# Patient Record
Sex: Male | Born: 1975 | Race: Black or African American | Hispanic: No | Marital: Single | Smoking: Current every day smoker
Health system: Southern US, Community
[De-identification: ages and names within clinical notes are randomized; demographics above are authoritative.]

## PROBLEM LIST (undated history)

## (undated) ENCOUNTER — Ambulatory Visit: Source: Home / Self Care

---

## 2005-09-19 ENCOUNTER — Emergency Department (HOSPITAL_COMMUNITY): Admission: EM | Admit: 2005-09-19 | Discharge: 2005-09-19 | Payer: Self-pay | Admitting: Emergency Medicine

## 2009-03-08 ENCOUNTER — Emergency Department (HOSPITAL_COMMUNITY): Admission: EM | Admit: 2009-03-08 | Discharge: 2009-03-08 | Payer: Self-pay | Admitting: Emergency Medicine

## 2011-04-11 ENCOUNTER — Emergency Department (HOSPITAL_COMMUNITY)
Admission: EM | Admit: 2011-04-11 | Discharge: 2011-04-11 | Disposition: A | Discharge status: Home / Self Care | Payer: Self-pay | Attending: Emergency Medicine | Admitting: Emergency Medicine

## 2011-04-11 DIAGNOSIS — R51 Headache: Secondary | ICD-10-CM | POA: Insufficient documentation

## 2011-04-11 DIAGNOSIS — H539 Unspecified visual disturbance: Secondary | ICD-10-CM | POA: Insufficient documentation

## 2012-07-08 ENCOUNTER — Encounter (HOSPITAL_COMMUNITY): Payer: Self-pay | Admitting: Emergency Medicine

## 2012-07-08 ENCOUNTER — Emergency Department (HOSPITAL_COMMUNITY): Payer: Worker's Compensation

## 2012-07-08 ENCOUNTER — Emergency Department (HOSPITAL_COMMUNITY)
Admission: EM | Admit: 2012-07-08 | Discharge: 2012-07-08 | Disposition: A | Discharge status: Home / Self Care | Payer: Worker's Compensation | Attending: Emergency Medicine | Admitting: Emergency Medicine

## 2012-07-08 DIAGNOSIS — T148XXA Other injury of unspecified body region, initial encounter: Secondary | ICD-10-CM

## 2012-07-08 DIAGNOSIS — X500XXA Overexertion from strenuous movement or load, initial encounter: Secondary | ICD-10-CM | POA: Insufficient documentation

## 2012-07-08 DIAGNOSIS — Y9269 Other specified industrial and construction area as the place of occurrence of the external cause: Secondary | ICD-10-CM | POA: Insufficient documentation

## 2012-07-08 DIAGNOSIS — F17200 Nicotine dependence, unspecified, uncomplicated: Secondary | ICD-10-CM | POA: Insufficient documentation

## 2012-07-08 DIAGNOSIS — M25519 Pain in unspecified shoulder: Secondary | ICD-10-CM | POA: Insufficient documentation

## 2012-07-08 MED ORDER — KETOROLAC TROMETHAMINE 60 MG/2ML IM SOLN
60.0000 mg | Freq: Once | INTRAMUSCULAR | Status: AC
  Administered 2012-07-08: 60 mg via INTRAMUSCULAR
  Filled 2012-07-08: qty 2

## 2012-07-08 MED ORDER — OXYCODONE-ACETAMINOPHEN 5-325 MG PO TABS: 2.0000 | ORAL_TABLET | ORAL | Status: AC | PRN

## 2012-07-08 NOTE — ED Notes (Signed)
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

## 2012-07-08 NOTE — ED Provider Notes (Signed)
History     CSN: [id number]  Arrival date & time 07/08/12  1859   First MD Initiated Contact with Patient 07/08/12 2032      Chief Complaint  Patient presents with  . Shoulder Pain    (Consider location/radiation/quality/duration/timing/severity/associated sxs/prior treatment) Patient is a 36 y.o. male presenting with shoulder pain. The history is provided by the patient.  Shoulder Pain   36 year old, right-hand dominant male, complains of pain in the right side of his back.  He was at work, lifting something heavy and he felt a pop in the right side of his back.  Since then it has been difficult to raise his right arm.  Because of the pain.  He did not fall.  He is right-hand-dominant.  He denies prior injury to his shoulder or back.  He has no other complaints.  History reviewed. No pertinent past medical history.  History reviewed. No pertinent past surgical history.  No family history on file.  History  Substance Use Topics  . Smoking status: Current Everyday Smoker -- 1.0 packs/day  . Smokeless tobacco: Not on file  . Alcohol Use: Yes     occasion      Review of Systems  Musculoskeletal: Positive for back pain.  Neurological: Negative for weakness.  Hematological: Does not bruise/bleed easily.    Allergies  Review of patient's allergies indicates no known allergies.  Home Medications  No current outpatient prescriptions on file.  BP 122/70  Pulse 73  Temp 99 F (37.2 C) (Oral)  Resp 18  SpO2 99%  Physical Exam  Nursing note and vitals reviewed. Constitutional: He appears well-developed and well-nourished.  HENT:  Head: Normocephalic and atraumatic.  Eyes: Conjunctivae are normal.  Neck: Normal range of motion.  Pulmonary/Chest: Effort normal.  Abdominal: He exhibits no distension.  Musculoskeletal: He exhibits no edema.       Decreased ability to abduct right arm.  Because of pain in the back.  Tenderness over the rhomboid muscles of the  back. Right Shoulder is nontender.  No swelling, and no deformity. Biceps strength is normal.  His arm from the right shoulder to the hand is normal    ED Course  Procedures (including critical care time)  Labs Reviewed - No data to display Dg Shoulder Right  07/08/2012  *RADIOLOGY REPORT*  Clinical Data: Right-sided shoulder pain.  RIGHT SHOULDER - 2+ VIEW  Comparison: None.  Findings: The joint spaces are maintained.  No fracture.  No abnormal soft tissue calcifications.  The right lung is clear. Lucent line involving the first anterior rib does not have the appearance of acute fracture and could be artifact.  IMPRESSION: No acute fracture.  Original Report Authenticated By: P. Loralie Champagne, M.D.     No diagnosis found.  Reviewed.  X-ray, no fracture or dislocation  MDM  Probable tear of one of the rhomboid muscles on the right side.  Doubt rotator cuff injury.  No fracture or dislocation        Cheri Guppy, MD 07/08/12 2112

## 2012-07-08 NOTE — Progress Notes (Signed)
Orthopedic Tech Progress Note Patient Details:  Dustin Hendrix [birth year] [id number]  Ortho Devices Type of Ortho Device: Arm foam sling Ortho Device/Splint Location: (R) UE Ortho Device/Splint Interventions: Application   Jennye Moccasin 07/08/2012, 9:33 PM

## 2014-05-04 ENCOUNTER — Encounter (HOSPITAL_COMMUNITY): Payer: Self-pay | Admitting: Emergency Medicine

## 2014-05-04 ENCOUNTER — Emergency Department (HOSPITAL_COMMUNITY)
Admission: EM | Admit: 2014-05-04 | Discharge: 2014-05-04 | Disposition: A | Discharge status: Home / Self Care | Payer: BC Managed Care – PPO | Attending: Emergency Medicine | Admitting: Emergency Medicine

## 2014-05-04 ENCOUNTER — Emergency Department (HOSPITAL_COMMUNITY): Payer: BC Managed Care – PPO

## 2014-05-04 DIAGNOSIS — D72829 Elevated white blood cell count, unspecified: Secondary | ICD-10-CM | POA: Insufficient documentation

## 2014-05-04 DIAGNOSIS — F17200 Nicotine dependence, unspecified, uncomplicated: Secondary | ICD-10-CM | POA: Insufficient documentation

## 2014-05-04 DIAGNOSIS — R1084 Generalized abdominal pain: Secondary | ICD-10-CM | POA: Insufficient documentation

## 2014-05-04 DIAGNOSIS — R109 Unspecified abdominal pain: Secondary | ICD-10-CM

## 2014-05-04 DIAGNOSIS — R112 Nausea with vomiting, unspecified: Secondary | ICD-10-CM | POA: Insufficient documentation

## 2014-05-04 LAB — CBC WITH DIFFERENTIAL/PLATELET
BASOS ABS: 0.1 10*3/uL (ref 0.0–0.1)
BASOS PCT: 1 % (ref 0–1)
EOS ABS: 0 10*3/uL (ref 0.0–0.7)
EOS PCT: 0 % (ref 0–5)
HCT: 44.4 % (ref 39.0–52.0)
Hemoglobin: 14.6 g/dL (ref 13.0–17.0)
LYMPHS ABS: 1.8 10*3/uL (ref 0.7–4.0)
Lymphocytes Relative: 16 % (ref 12–46)
MCH: 27.3 pg (ref 26.0–34.0)
MCHC: 32.9 g/dL (ref 30.0–36.0)
MCV: 83.1 fL (ref 78.0–100.0)
Monocytes Absolute: 0.5 10*3/uL (ref 0.1–1.0)
Monocytes Relative: 5 % (ref 3–12)
NEUTROS PCT: 78 % — AB (ref 43–77)
Neutro Abs: 8.9 10*3/uL — ABNORMAL HIGH (ref 1.7–7.7)
PLATELETS: 241 10*3/uL (ref 150–400)
RBC: 5.34 MIL/uL (ref 4.22–5.81)
RDW: 12.9 % (ref 11.5–15.5)
WBC: 11.3 10*3/uL — ABNORMAL HIGH (ref 4.0–10.5)

## 2014-05-04 LAB — COMPREHENSIVE METABOLIC PANEL
ALBUMIN: 4.9 g/dL (ref 3.5–5.2)
ALT: 21 U/L (ref 0–53)
AST: 29 U/L (ref 0–37)
Alkaline Phosphatase: 80 U/L (ref 39–117)
BUN: 17 mg/dL (ref 6–23)
CALCIUM: 9.4 mg/dL (ref 8.4–10.5)
CO2: 22 mEq/L (ref 19–32)
CREATININE: 0.95 mg/dL (ref 0.50–1.35)
Chloride: 102 mEq/L (ref 96–112)
GFR calc Af Amer: >90 mL/min (ref >90)
Glucose, Bld: 65 mg/dL — ABNORMAL LOW (ref 70–99)
Potassium: 3.9 mEq/L (ref 3.7–5.3)
SODIUM: 144 meq/L (ref 137–147)
TOTAL PROTEIN: 8.2 g/dL (ref 6.0–8.3)
Total Bilirubin: 0.3 mg/dL (ref 0.3–1.2)

## 2014-05-04 LAB — URINALYSIS, ROUTINE W REFLEX MICROSCOPIC
BILIRUBIN URINE: NEGATIVE
Glucose, UA: NEGATIVE mg/dL
HGB URINE DIPSTICK: NEGATIVE
Ketones, ur: 15 mg/dL — AB
Nitrite: NEGATIVE
PH: 5 (ref 5.0–8.0)
Protein, ur: NEGATIVE mg/dL
SPECIFIC GRAVITY, URINE: 1.023 (ref 1.005–1.030)
UROBILINOGEN UA: 0.2 mg/dL (ref 0.0–1.0)

## 2014-05-04 LAB — LIPASE, BLOOD: LIPASE: 52 U/L (ref 11–59)

## 2014-05-04 LAB — URINE MICROSCOPIC-ADD ON

## 2014-05-04 MED ORDER — IOHEXOL 300 MG/ML  SOLN
50.0000 mL | Freq: Once | INTRAMUSCULAR | Status: AC | PRN
  Administered 2014-05-04: 50 mL via ORAL

## 2014-05-04 MED ORDER — HYDROCODONE-ACETAMINOPHEN 5-325 MG PO TABS: 1.0000 | ORAL_TABLET | Freq: Four times a day (QID) | ORAL | Status: DC | PRN

## 2014-05-04 MED ORDER — MORPHINE SULFATE 4 MG/ML IJ SOLN
4.0000 mg | Freq: Once | INTRAMUSCULAR | Status: AC
  Administered 2014-05-04: 4 mg via INTRAVENOUS
  Filled 2014-05-04: qty 1

## 2014-05-04 MED ORDER — SODIUM CHLORIDE 0.9 % IV BOLUS (SEPSIS)
1000.0000 mL | Freq: Once | INTRAVENOUS | Status: AC
  Administered 2014-05-04: 1000 mL via INTRAVENOUS

## 2014-05-04 MED ORDER — IOHEXOL 300 MG/ML  SOLN
100.0000 mL | Freq: Once | INTRAMUSCULAR | Status: AC | PRN
  Administered 2014-05-04: 100 mL via INTRAVENOUS

## 2014-05-04 MED ORDER — ONDANSETRON HCL 4 MG/2ML IJ SOLN
4.0000 mg | Freq: Once | INTRAMUSCULAR | Status: AC
  Administered 2014-05-04: 4 mg via INTRAVENOUS
  Filled 2014-05-04: qty 2

## 2014-05-04 MED ORDER — ONDANSETRON HCL 4 MG PO TABS: 4.0000 mg | ORAL_TABLET | Freq: Four times a day (QID) | ORAL | Status: DC

## 2014-05-04 NOTE — Discharge Instructions (Signed)
Abdominal Pain, Adult °Many things can cause abdominal pain. Usually, abdominal pain is not caused by a disease and will improve without treatment. It can often be observed and treated at home. Your health care provider will do a physical exam and possibly order blood tests and X-rays to help determine the seriousness of your pain. However, in many cases, more time must pass before a clear cause of the pain can be found. Before that point, your health care provider may not know if you need more testing or further treatment. °HOME CARE INSTRUCTIONS  °Monitor your abdominal pain for any changes. The following actions may help to alleviate any discomfort you are experiencing: °· Only take over-the-counter or prescription medicines as directed by your health care provider. °· Do not take laxatives unless directed to do so by your health care provider. °· Try a clear liquid diet (broth, tea, or water) as directed by your health care provider. Slowly move to a bland diet as tolerated. °SEEK MEDICAL CARE IF: °· You have unexplained abdominal pain. °· You have abdominal pain associated with nausea or diarrhea. °· You have pain when you urinate or have a bowel movement. °· You experience abdominal pain that wakes you in the night. °· You have abdominal pain that is worsened or improved by eating food. °· You have abdominal pain that is worsened with eating fatty foods. °SEEK IMMEDIATE MEDICAL CARE IF:  °· Your pain does not go away within 2 hours. °· You have a fever. °· You keep throwing up (vomiting). °· Your pain is felt only in portions of the abdomen, such as the right side or the left lower portion of the abdomen. °· You pass bloody or black tarry stools. °MAKE SURE YOU: °· Understand these instructions.   °· Will watch your condition.   °· Will get help right away if you are not doing well or get worse.   °Document Released: 08/30/2005 Document Revised: 09/10/2013 Document Reviewed: 07/30/2013 °[employer]® Patient  Information ©2014 [employer]. ° °

## 2014-05-04 NOTE — ED Provider Notes (Signed)
CSN: [id number]     Arrival date & time 05/04/14  4098 History   First MD Initiated Contact with Patient 05/04/14 1001     Chief Complaint  Patient presents with  . Abdominal Cramping  . vomiting      (Consider location/radiation/quality/duration/timing/severity/associated sxs/prior Treatment) HPI Comments: Patient presents to the ED with a chief complaint of nausea, vomiting, and abdominal pain.  Patient states that the pain started last night and has continued into this morning. He reports one episode of nonbloody nonbilious vomiting, denies any diarrhea or constipation. He states that his last bowel movement was yesterday, and was normal for him. He has been passing gas this morning he denies any fevers or chills.  He has not taken anything to alleviate his symptoms. He denies any past surgical history.  The history is provided by the patient. No language interpreter was used.    History reviewed. No pertinent past medical history. History reviewed. No pertinent past surgical history. No family history on file. History  Substance Use Topics  . Smoking status: Current Every Day Smoker -- 1.00 packs/day  . Smokeless tobacco: Not on file  . Alcohol Use: Yes     Comment: occasion    Review of Systems  Constitutional: Negative for fever and chills.  Respiratory: Negative for shortness of breath.   Cardiovascular: Negative for chest pain.  Gastrointestinal: Negative for nausea, vomiting, diarrhea and constipation.  Genitourinary: Negative for dysuria.      Allergies  Review of patient's allergies indicates no known allergies.  Home Medications   Prior to Admission medications   Not on File   BP 112/55  Pulse 60  Temp(Src) 98.3 F (36.8 C) (Oral)  Resp 19  SpO2 98% Physical Exam  Nursing note and vitals reviewed. Constitutional: He is oriented to person, place, and time. He appears well-developed and well-nourished.  HENT:  Head: Normocephalic and atraumatic.  Eyes:  Conjunctivae and EOM are normal. Pupils are equal, round, and reactive to light. Right eye exhibits no discharge. Left eye exhibits no discharge. No scleral icterus.  Neck: Normal range of motion. Neck supple. No JVD present.  Cardiovascular: Normal rate, regular rhythm and normal heart sounds.  Exam reveals no gallop and no friction rub.   No murmur heard. Pulmonary/Chest: Effort normal and breath sounds normal. No respiratory distress. He has no wheezes. He has no rales. He exhibits no tenderness.  Abdominal: Soft. He exhibits no distension and no mass. There is no tenderness. There is no rebound and no guarding.  Diffuse lower abdominal tenderness, no focal upper abdominal tenderness  Musculoskeletal: Normal range of motion. He exhibits no edema and no tenderness.  Neurological: He is alert and oriented to person, place, and time.  Skin: Skin is warm and dry.  Psychiatric: He has a normal mood and affect. His behavior is normal. Judgment and thought content normal.    ED Course  Procedures (including critical care time) Results for orders placed during the hospital encounter of 05/04/14  CBC WITH DIFFERENTIAL      Result Value Ref Range   WBC 11.3 (*) 4.0 - 10.5 K/uL   RBC 5.34  4.22 - 5.81 MIL/uL   Hemoglobin 14.6  13.0 - 17.0 g/dL   HCT 11.9  14.7 - 82.9 %   MCV 83.1  78.0 - 100.0 fL   MCH 27.3  26.0 - 34.0 pg   MCHC 32.9  30.0 - 36.0 g/dL   RDW 56.2  13.0 - 86.5 %  Platelets 241  150 - 400 K/uL   Neutrophils Relative % 78 (*) 43 - 77 %   Neutro Abs 8.9 (*) 1.7 - 7.7 K/uL   Lymphocytes Relative 16  12 - 46 %   Lymphs Abs 1.8  0.7 - 4.0 K/uL   Monocytes Relative 5  3 - 12 %   Monocytes Absolute 0.5  0.1 - 1.0 K/uL   Eosinophils Relative 0  0 - 5 %   Eosinophils Absolute 0.0  0.0 - 0.7 K/uL   Basophils Relative 1  0 - 1 %   Basophils Absolute 0.1  0.0 - 0.1 K/uL  LIPASE, BLOOD      Result Value Ref Range   Lipase 52  11 - 59 U/L  COMPREHENSIVE METABOLIC PANEL      Result  Value Ref Range   Sodium 144  137 - 147 mEq/L   Potassium 3.9  3.7 - 5.3 mEq/L   Chloride 102  96 - 112 mEq/L   CO2 22  19 - 32 mEq/L   Glucose, Bld 65 (*) 70 - 99 mg/dL   BUN 17  6 - 23 mg/dL   Creatinine, Ser 7.840.95  0.50 - 1.35 mg/dL   Calcium 9.4  8.4 - 69.610.5 mg/dL   Total Protein 8.2  6.0 - 8.3 g/dL   Albumin 4.9  3.5 - 5.2 g/dL   AST 29  0 - 37 U/L   ALT 21  0 - 53 U/L   Alkaline Phosphatase 80  39 - 117 U/L   Total Bilirubin 0.3  0.3 - 1.2 mg/dL   GFR calc non Af Amer >90  >90 mL/min   GFR calc Af Amer >90  >90 mL/min  URINALYSIS, ROUTINE W REFLEX MICROSCOPIC      Result Value Ref Range   Color, Urine YELLOW  YELLOW   APPearance CLEAR  CLEAR   Specific Gravity, Urine 1.023  1.005 - 1.030   pH 5.0  5.0 - 8.0   Glucose, UA NEGATIVE  NEGATIVE mg/dL   Hgb urine dipstick NEGATIVE  NEGATIVE   Bilirubin Urine NEGATIVE  NEGATIVE   Ketones, ur 15 (*) NEGATIVE mg/dL   Protein, ur NEGATIVE  NEGATIVE mg/dL   Urobilinogen, UA 0.2  0.0 - 1.0 mg/dL   Nitrite NEGATIVE  NEGATIVE   Leukocytes, UA SMALL (*) NEGATIVE  URINE MICROSCOPIC-ADD ON      Result Value Ref Range   Squamous Epithelial / LPF RARE  RARE   WBC, UA 7-10  <3 WBC/hpf   Ct Abdomen Pelvis W Contrast  05/04/2014   CLINICAL DATA:  Abdominal pain and cramping  EXAM: CT ABDOMEN AND PELVIS WITH CONTRAST  TECHNIQUE: Multidetector CT imaging of the abdomen and pelvis was performed using the standard protocol following bolus administration of intravenous contrast. Oral contrast was also administered.  CONTRAST:  100mL OMNIPAQUE IOHEXOL 300 MG/ML  SOLN  COMPARISON:  None.  FINDINGS: The lung bases are clear.  No focal liver lesions are identified. There is no appreciable biliary duct dilatation. Gallbladder wall is not appreciably thickened.  Spleen, pancreas, and adrenals appear normal. Kidneys bilaterally show no appreciable mass or hydronephrosis. There is no renal or ureteral calculus on either side.  In the pelvis, the urinary bladder  is midline with normal wall thickness. There is no pelvic mass or fluid collection. The appendix appears normal.  There is no bowel obstruction.  No free air or portal venous air.  There is no ascites, adenopathy, or abscess  in the abdomen or pelvis. There is no abdominal aortic aneurysm. There are no blastic or lytic bone lesions.  IMPRESSION: No mesenteric inflammation or abscess. Appendix appears normal. No bowel obstruction. No renal or ureteral calculus. No hydronephrosis.   Electronically Signed   By: Bretta Bang M.D.   On: 05/04/2014 12:54      EKG Interpretation None      MDM   Final diagnoses:  Abdominal pain    Patient with abdominal pain and vomiting since last night. He is afebrile. Is not in any apparent distress. Recheck basic labs, treat pain, and will reassess. Serial abdominal exams.  11:42 AM Patient has a mild leukocytosis, however after serial abdominal exams, he is still having right lower quadrant pain. Will proceed with CT scan of the abdomen.  12:58 PM CT is reassuring.  Appendix is normal.  Will discharge with some pain meds and zofran.  Recommend PCP follow-up.  Patient instructed to return for:  New or worsening symptoms, including, increased abdominal pain, especially pain that localizes to one side, bloody vomit, bloody diarrhea, fever >101, and intractable vomiting.     Roxy Horseman, PA-C 05/04/14 1551

## 2014-05-04 NOTE — ED Provider Notes (Signed)
Medical screening examination/treatment/procedure(s) were performed by non-physician practitioner and as supervising physician I was immediately available for consultation/collaboration.   EKG Interpretation None        Richardean Canal, MD 05/04/14 [phone]

## 2014-05-04 NOTE — ED Notes (Signed)
Pt c/o mid abd pain that started last night. Pt states that he went to work this morning vomited once.  Pt states the abd cramping is intermittent but unsure what makes it better or worse. Pt denies constipation or diarrhea.

## 2014-07-27 ENCOUNTER — Encounter (HOSPITAL_COMMUNITY): Payer: Self-pay | Admitting: Emergency Medicine

## 2014-07-27 ENCOUNTER — Emergency Department (HOSPITAL_COMMUNITY)
Admission: EM | Admit: 2014-07-27 | Discharge: 2014-07-27 | Disposition: A | Discharge status: Home / Self Care | Payer: BC Managed Care – PPO | Attending: Emergency Medicine | Admitting: Emergency Medicine

## 2014-07-27 DIAGNOSIS — M7541 Impingement syndrome of right shoulder: Secondary | ICD-10-CM

## 2014-07-27 DIAGNOSIS — M259 Joint disorder, unspecified: Secondary | ICD-10-CM | POA: Insufficient documentation

## 2014-07-27 DIAGNOSIS — F17200 Nicotine dependence, unspecified, uncomplicated: Secondary | ICD-10-CM | POA: Insufficient documentation

## 2014-07-27 DIAGNOSIS — M25519 Pain in unspecified shoulder: Secondary | ICD-10-CM | POA: Diagnosis present

## 2014-07-27 MED ORDER — HYDROCODONE-ACETAMINOPHEN 5-325 MG PO TABS: 1.0000 | ORAL_TABLET | Freq: Four times a day (QID) | ORAL | Status: DC | PRN

## 2014-07-27 MED ORDER — IBUPROFEN 800 MG PO TABS: 800.0000 mg | ORAL_TABLET | Freq: Three times a day (TID) | ORAL | Status: DC

## 2014-07-27 NOTE — ED Provider Notes (Signed)
CSN: [id number]     Arrival date & time 07/27/14  1711 History  This chart was scribed for non-physician practitioner working with Mirian Mo, MD, by Modena Jansky, ED Scribe. This patient was seen in room WTR7/WTR7 and the patient's care was started at 6:46 PM.   Chief Complaint  Patient presents with  . Shoulder Pain    right   Patient is a 38 y.o. male presenting with shoulder pain. The history is provided by the patient. No language interpreter was used.  Shoulder Pain Pertinent negatives include no chest pain and no shortness of breath.   HPI Comments: Dustin Hendrix is a 38 y.o. male who presents to the Emergency Department complaining of moderate intermittent right shoulder pain that started about 2 weeks ago. He states that he torn a muscle there about a year ago. He denies any recent injury. 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 Has not tried taking anything to alleviate his symptoms.  History reviewed. No pertinent past medical history. History reviewed. No pertinent past surgical history. No family history on file. History  Substance Use Topics  . Smoking status: Current Every Day Smoker -- 1.00 packs/day  . Smokeless tobacco: Not on file  . Alcohol Use: Yes     Comment: occasion    Review of Systems  Constitutional: Negative for fever and chills.  Respiratory: Negative for shortness of breath.   Cardiovascular: Negative for chest pain.  Gastrointestinal: Negative for nausea, vomiting, diarrhea and constipation.  Genitourinary: Negative for dysuria.  Musculoskeletal: Positive for arthralgias and joint swelling.    Allergies  Review of patient's allergies indicates no known allergies.  Home Medications   Prior to Admission medications   Not on File   BP 125/67  Pulse 71  Temp(Src) 98.6 F (37 C) (Oral)  Resp 16  SpO2 98% Physical Exam  Nursing note and vitals reviewed. Constitutional: He is oriented to person, place, and time. He  appears well-developed and well-nourished. No distress.  HENT:  Head: Normocephalic and atraumatic.  Neck: Neck supple. No tracheal deviation present.  Cardiovascular: Normal rate.   Pulmonary/Chest: Effort normal. No respiratory distress.  Musculoskeletal: Normal range of motion. He exhibits tenderness.  Right shoulder moderately TTP over the anterior aspect.  No bony abnormal or deformity noted. ROM and strength 5/5. Negative empty can test, increased pain with Hawkins-Kennedy's test.   Neurological: He is alert and oriented to person, place, and time.  Skin: Skin is warm and dry.  Psychiatric: He has a normal mood and affect. His behavior is normal.    ED Course  Procedures (including critical care time) DIAGNOSTIC STUDIES: Oxygen Saturation is 98% on RA, normal by my interpretation.    COORDINATION OF CARE: 6:51 PM- Pt advised of plan for treatment and pt agrees.  Labs Review Labs Reviewed - No data to display  Imaging Review No results found.   EKG Interpretation None      MDM   Final diagnoses:  Shoulder impingement, right    Patient with shoulder impingement.  DC to home with ortho follow-up.  I personally performed the services described in this documentation, which was scribed in my presence. The recorded information has been reviewed and is accurate.      Roxy Horseman, PA-C 07/27/14 [phone]

## 2014-07-27 NOTE — ED Notes (Signed)
Pt c/o right shoulder pain x 1 year after torn muscle.

## 2014-07-27 NOTE — Discharge Instructions (Signed)

## 2014-07-28 NOTE — ED Provider Notes (Signed)
Medical screening examination/treatment/procedure(s) were performed by non-physician practitioner and as supervising physician I was immediately available for consultation/collaboration.   EKG Interpretation None        Mirian Mo, MD 07/28/14 [phone]

## 2014-07-29 ENCOUNTER — Ambulatory Visit (INDEPENDENT_AMBULATORY_CARE_PROVIDER_SITE_OTHER): Payer: BC Managed Care – PPO

## 2014-07-29 ENCOUNTER — Ambulatory Visit (INDEPENDENT_AMBULATORY_CARE_PROVIDER_SITE_OTHER): Payer: BC Managed Care – PPO | Admitting: Family Medicine

## 2014-07-29 VITALS — BP 120/74 | HR 65 | Temp 98.0°F | Resp 16 | Ht 68.25 in | Wt 142.8 lb

## 2014-07-29 DIAGNOSIS — M25519 Pain in unspecified shoulder: Secondary | ICD-10-CM

## 2014-07-29 DIAGNOSIS — T148XXA Other injury of unspecified body region, initial encounter: Secondary | ICD-10-CM

## 2014-07-29 DIAGNOSIS — M67919 Unspecified disorder of synovium and tendon, unspecified shoulder: Secondary | ICD-10-CM

## 2014-07-29 DIAGNOSIS — M25511 Pain in right shoulder: Secondary | ICD-10-CM

## 2014-07-29 DIAGNOSIS — M7541 Impingement syndrome of right shoulder: Secondary | ICD-10-CM

## 2014-07-29 DIAGNOSIS — M719 Bursopathy, unspecified: Secondary | ICD-10-CM

## 2014-07-29 NOTE — Progress Notes (Signed)
Chief Complaint:  Chief Complaint  Patient presents with  . Shoulder Pain    -rt x1 week     HPI: Dustin Hendrix is a 38 y.o. 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 Denies any neck pain, numbness or tingling or weakness, feels like heating pad on his shoulder, he has pain in his lateral shoulder and also midline of his shoulder blade.  He can lift to shoudler height with out pain, he has 8/10 pain, he has not tried anything  for it. He was in pain so went to the ER and had it looked at and was rx ibuprofen and also pain meds but has not picked it up yet. No xrays were done at the time, last xray in 2013 did not show any abnormlaities. He has not been able to work out, he used to [employer] and also pushups but has not done that in 2 months. He injured it the first time and heard soemthing pop at work, he states that resolved. 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     History reviewed. No pertinent past medical history. History reviewed. No pertinent past surgical history. History   Social History  . Marital Status: Single    Spouse Name: N/A    Number of Children: N/A  . Years of Education: N/A   Social History Main Topics  . Smoking status: Current Every Day Smoker -- 1.00 packs/day for 10 years  . Smokeless tobacco: None  . Alcohol Use: 1.5 oz/week    3 drink(s) per week     Comment: occasion  . Drug Use: No  . Sexual Activity: None   Other Topics Concern  . None   Social History Narrative  . None   History reviewed. No pertinent family history. No Known Allergies Prior to Admission medications   Medication Sig Start Date End Date Taking? Authorizing Provider  HYDROcodone-acetaminophen  (NORCO/VICODIN) 5-325 MG per tablet Take 1-2 tablets by mouth every 6 (six) hours as needed for moderate pain or severe pain. 07/27/14  Yes Roxy Horseman, PA-C  ibuprofen (ADVIL,MOTRIN) 800 MG tablet Take 1 tablet (800 mg total) by mouth 3 (three) times daily. 07/27/14  Yes Roxy Horseman, PA-C     ROS: The patient denies fevers, chills, night sweats, unintentional weight loss, chest pain, palpitations, wheezing, dyspnea on exertion, nausea, vomiting, abdominal pain, dysuria, hematuria, melena, numbness, weakness, or tingling.   All other systems have been reviewed and were otherwise negative with the exception of those mentioned in the HPI and as above.    PHYSICAL EXAM: Filed Vitals:   07/29/14 1538  BP: 120/74  Pulse: 65  Temp: 98 F (36.7 C)  Resp: 16   Filed Vitals:   07/29/14 1538  Height: 5' 8.25" (1.734 m)  Weight: 142 lb 12.8 oz (64.774 kg)   Body mass index is 21.54 kg/(m^2).  General: Alert, no acute distress HEENT:  Normocephalic, atraumatic, oropharynx patent. EOMI, PERRLA Cardiovascular:  Regular rate and rhythm, no rubs murmurs or gallops.  No Carotid bruits, radial pulse intact. No pedal edema.  Respiratory: Clear to auscultation bilaterally.  No wheezes, rales, or rhonchi.  No  cyanosis, no use of accessory musculature GI: No organomegaly, abdomen is soft and non-tender, positive bowel sounds.  No masses. Skin: No rashes. Neurologic: Facial musculature symmetric. Psychiatric: Patient is appropriate throughout our interaction. Lymphatic: No cervical lymphadenopathy Musculoskeletal: Gait intact. Neck-ROM is full, neg spurling Right shoulder-no deformities, no hypertrophy/atrophy no scapular dyskinesia, e/o long  Thoracic n dysfunction.        ROm of shoulder right limited to shoulder height, 90 degrees He  has pain with IR/ER and cannot due lift off test due to pain, he has pain with adduction, he has neg Empty Can  Good grip strength, neg speed's  test     LABS:    EKG/XRAY:   Primary read interpreted by Dr. Conley Rolls at [facility]. Neg for fx or dislocation   ASSESSMENT/PLAN: Encounter Diagnoses  Name Primary?  . Right shoulder pain Yes  . Sprain and strain   . Rotator cuff impingement syndrome, right    Exam is somewhat limited due to pain He will be referred to prtho, there is no light duty at work so I will write him out of work to return on Monday Will try to get him onto ortho today or tomorrow .  He can fill his pain meds from the ER he got yesterday F/u prn  Gross sideeffects, risk and benefits, and alternatives of medications d/w patient. Patient is aware that all medications have potential sideeffects and we are unable to predict every sideeffect or drug-drug interaction that may occur.  Hamilton Capri PHUONG, DO 07/29/2014 5:31 PM

## 2014-07-29 NOTE — Patient Instructions (Signed)

## 2015-06-05 IMAGING — CR DG SHOULDER 2+V*R*
4 series · 4 of 4 positions shown · non-contrast
Comparison: 07/08/2012.

CLINICAL DATA: Right shoulder pain.

EXAM:
RIGHT SHOULDER - 2+ VIEW

[AP (1 of 2)]
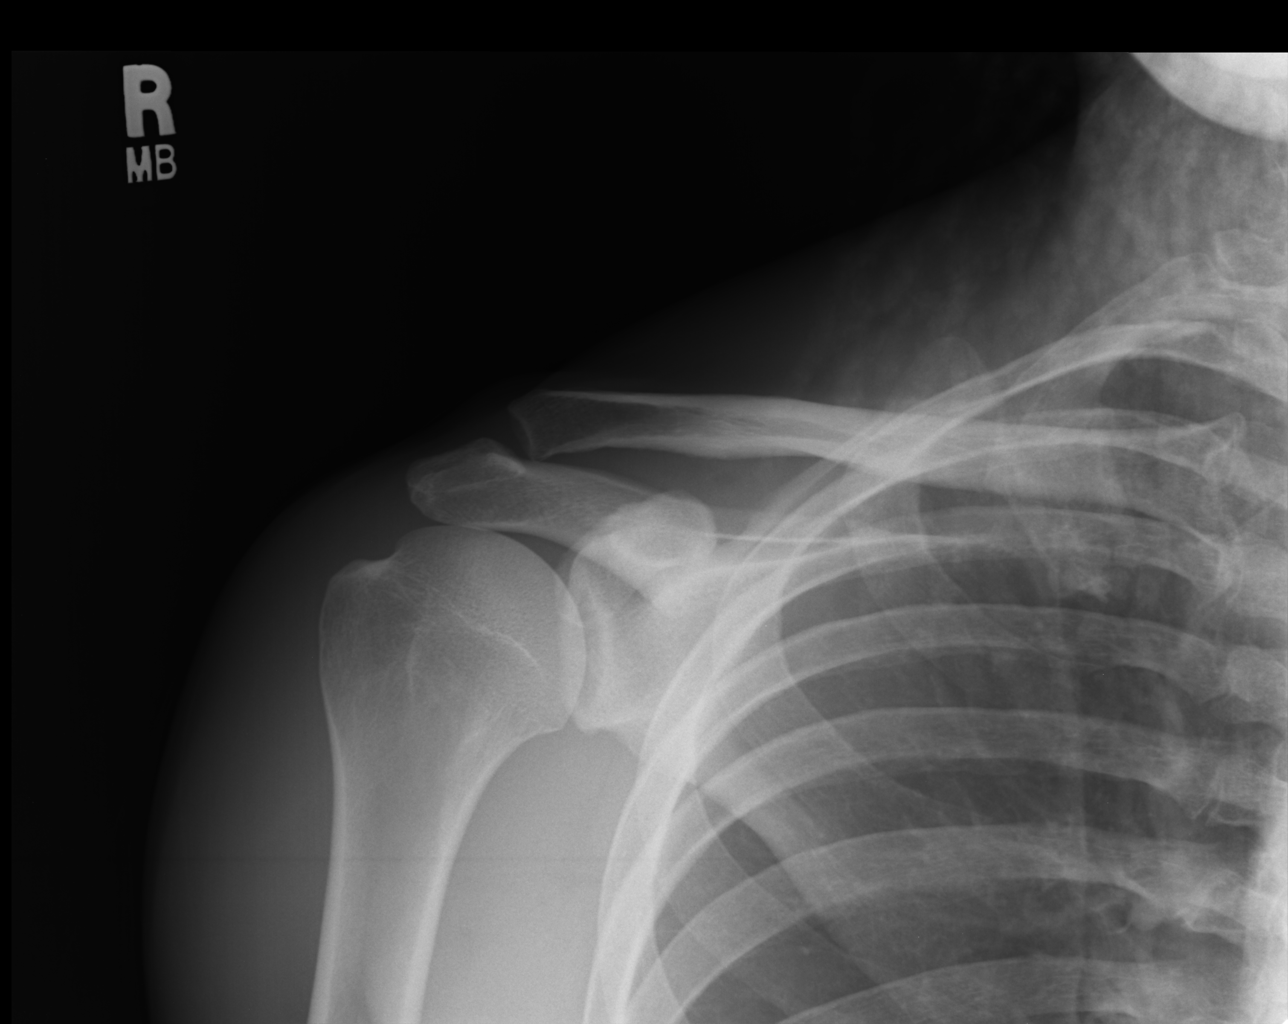

[ap ext rot]
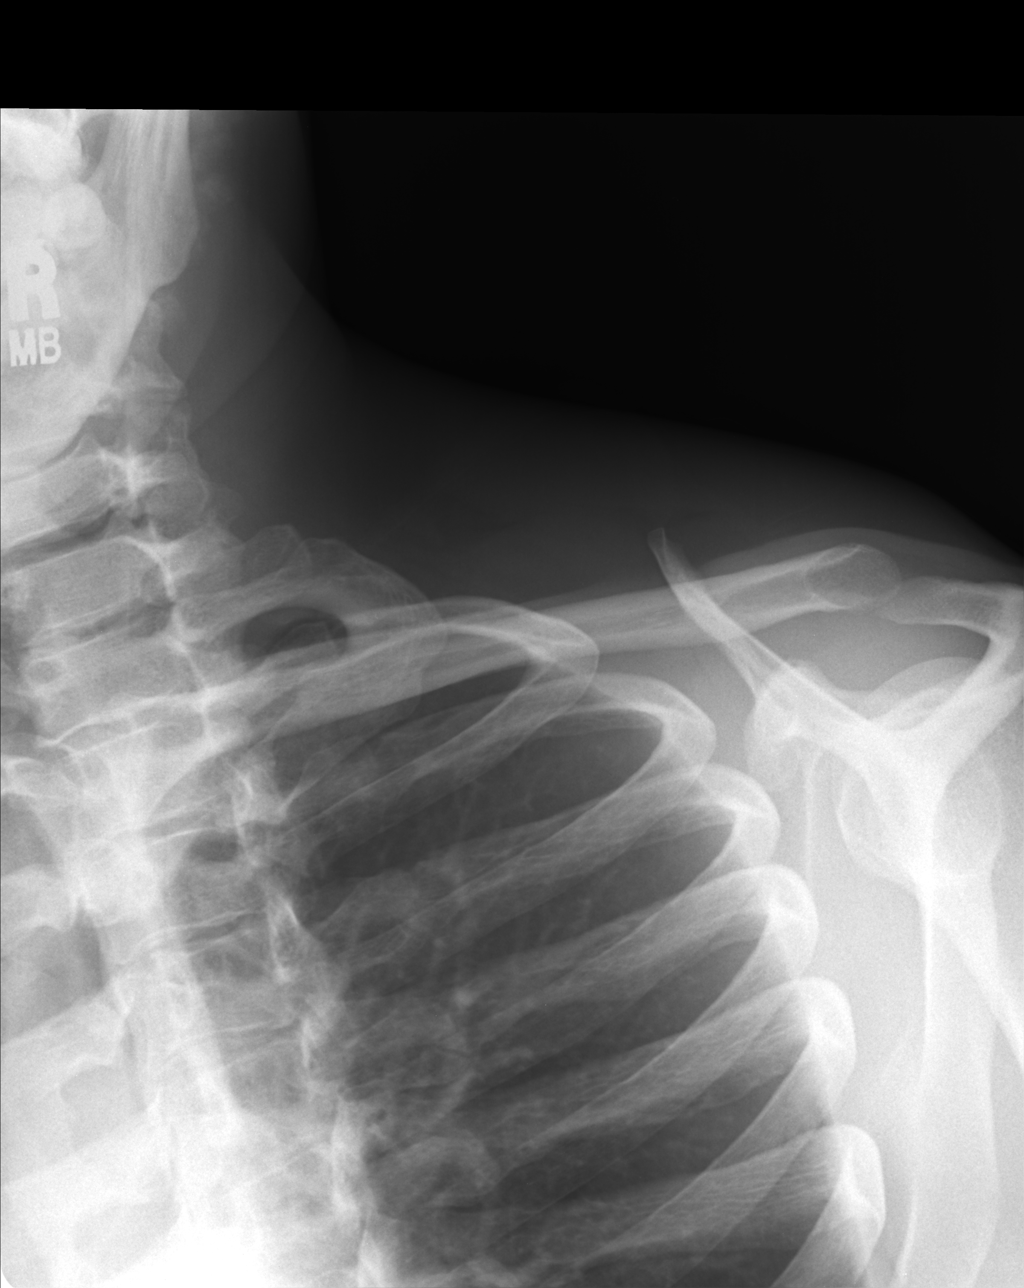

[ap int rot]
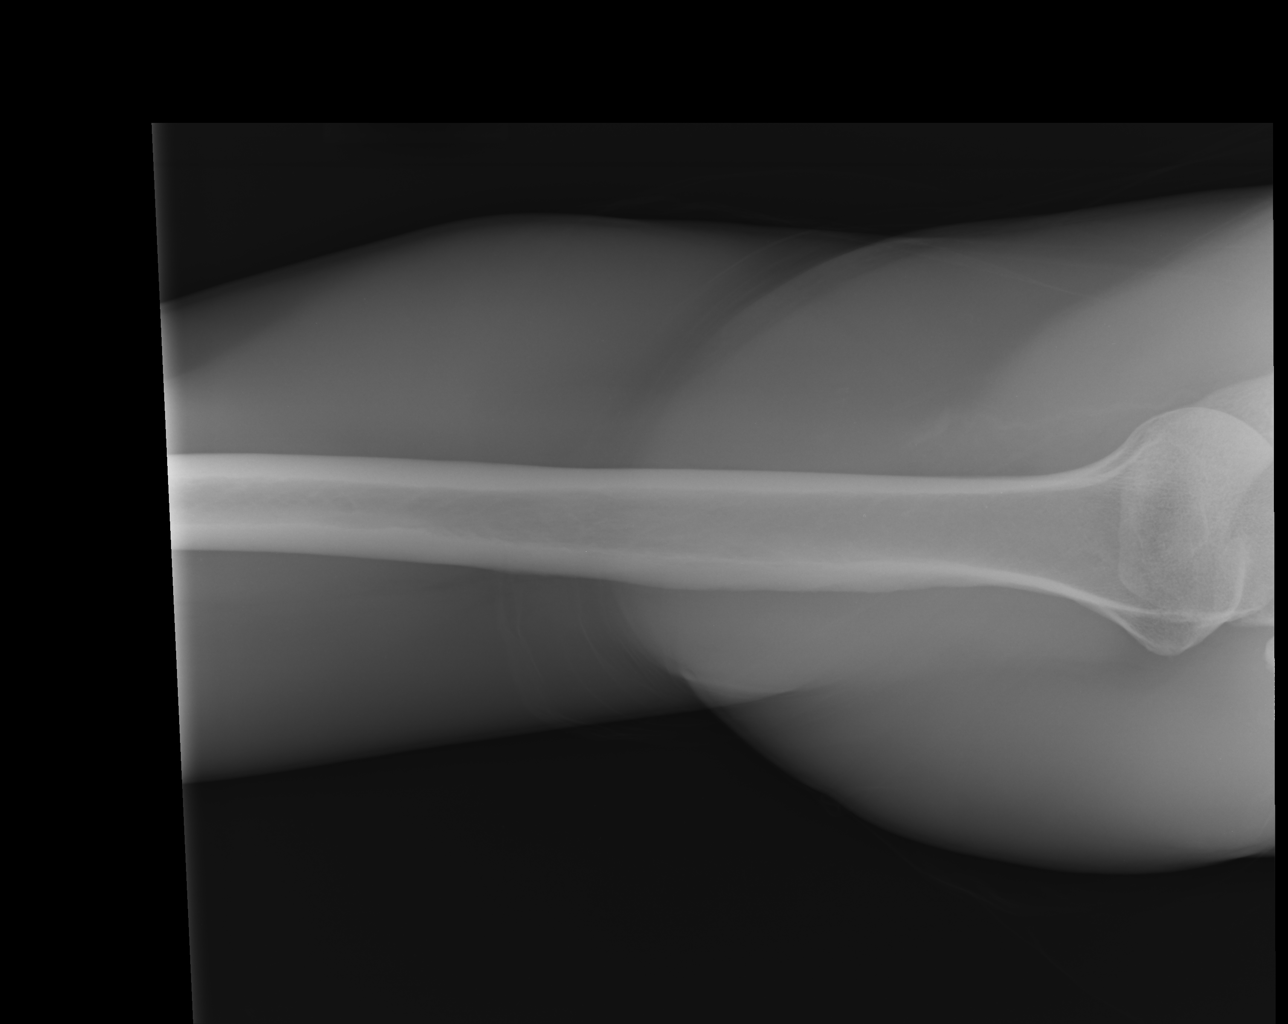

[AP (2 of 2)]
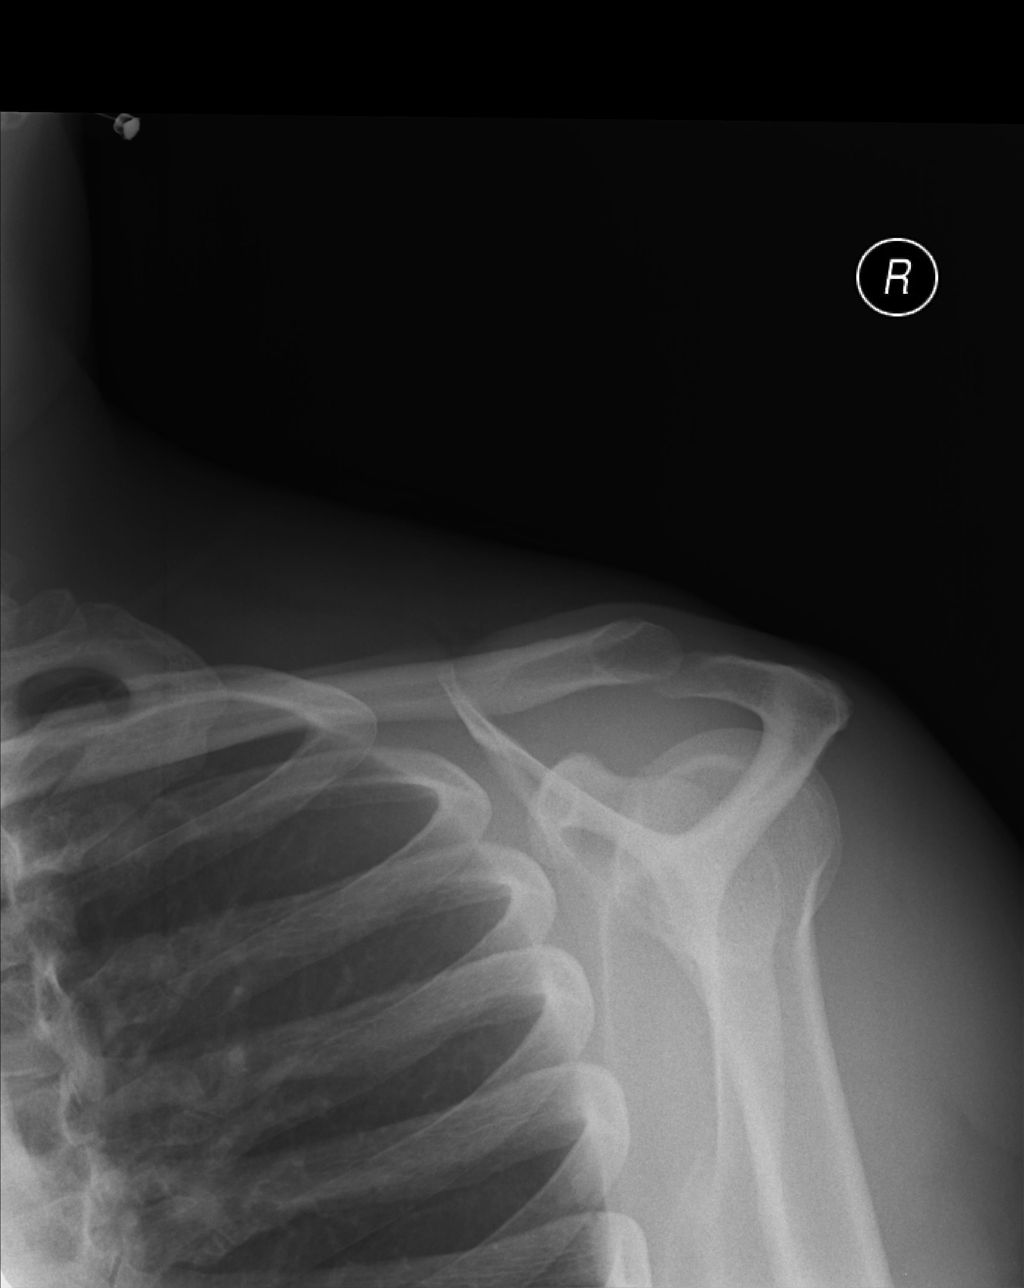

[4 of 4 positions shown; findings below may reference images not displayed]

FINDINGS: There is no evidence of fracture or dislocation. There is no
evidence of arthropathy or other focal bone abnormality. Soft
tissues are unremarkable.
IMPRESSION: Negative.

## 2015-09-20 ENCOUNTER — Encounter (HOSPITAL_COMMUNITY): Payer: Self-pay | Admitting: *Deleted

## 2015-09-20 ENCOUNTER — Emergency Department (HOSPITAL_COMMUNITY)
Admission: EM | Admit: 2015-09-20 | Discharge: 2015-09-20 | Disposition: A | Discharge status: Home / Self Care | Payer: BLUE CROSS/BLUE SHIELD | Attending: Emergency Medicine | Admitting: Emergency Medicine

## 2015-09-20 DIAGNOSIS — M549 Dorsalgia, unspecified: Secondary | ICD-10-CM | POA: Insufficient documentation

## 2015-09-20 DIAGNOSIS — J029 Acute pharyngitis, unspecified: Secondary | ICD-10-CM | POA: Diagnosis not present

## 2015-09-20 DIAGNOSIS — Z720 Tobacco use: Secondary | ICD-10-CM | POA: Insufficient documentation

## 2015-09-20 DIAGNOSIS — M542 Cervicalgia: Secondary | ICD-10-CM | POA: Insufficient documentation

## 2015-09-20 DIAGNOSIS — Z791 Long term (current) use of non-steroidal anti-inflammatories (NSAID): Secondary | ICD-10-CM | POA: Insufficient documentation

## 2015-09-20 LAB — CBC
HEMATOCRIT: 44.7 % (ref 39.0–52.0)
Hemoglobin: 15 g/dL (ref 13.0–17.0)
MCH: 28.3 pg (ref 26.0–34.0)
MCHC: 33.6 g/dL (ref 30.0–36.0)
MCV: 84.3 fL (ref 78.0–100.0)
Platelets: 197 10*3/uL (ref 150–400)
RBC: 5.3 MIL/uL (ref 4.22–5.81)
RDW: 12.5 % (ref 11.5–15.5)
WBC: 14.7 10*3/uL — AB (ref 4.0–10.5)

## 2015-09-20 LAB — BASIC METABOLIC PANEL
ANION GAP: 10 (ref 5–15)
BUN: 11 mg/dL (ref 6–20)
CO2: 27 mmol/L (ref 22–32)
Calcium: 9.3 mg/dL (ref 8.9–10.3)
Chloride: 99 mmol/L — ABNORMAL LOW (ref 101–111)
Creatinine, Ser: 1.04 mg/dL (ref 0.61–1.24)
GFR calc Af Amer: >60 mL/min (ref >60)
GFR calc non Af Amer: >60 mL/min (ref >60)
Glucose, Bld: 103 mg/dL — ABNORMAL HIGH (ref 65–99)
POTASSIUM: 4.1 mmol/L (ref 3.5–5.1)
Sodium: 136 mmol/L (ref 135–145)

## 2015-09-20 LAB — RAPID STREP SCREEN (MED CTR MEBANE ONLY): Streptococcus, Group A Screen (Direct): NEGATIVE

## 2015-09-20 MED ORDER — IBUPROFEN 600 MG PO TABS: 600.0000 mg | ORAL_TABLET | Freq: Four times a day (QID) | ORAL | Status: DC | PRN

## 2015-09-20 NOTE — Discharge Instructions (Signed)

## 2015-09-20 NOTE — ED Provider Notes (Signed)
CSN: [id number]     Arrival date & time 09/20/15  1739 History   By signing my name below, I, Dustin Hendrix, attest that this documentation has been prepared under the direction and in the presence of Ceaser Ebeling PA-C.  Electronically Signed: Arlan OrganAshley Hendrix, ED Scribe. 09/20/2015. 10:10 PM.   Chief Complaint  Patient presents with  . Sore Throat  . Neck Pain  . Back Pain   The history is provided by the patient. No language interpreter was used.    HPI Comments: Dustin Hendrix is a 39 y.o. male without any pertinent past medical history who presents to the Emergency Department complaining of constant, ongoing sore throat x 2 days. Currently pain is rated 5/10. Ongoing neck pain and back pain also reported. No aggravating or alleviating factors at this time. Denies any recent injury or trauma. No OTC medications or home remedies attempted prior to arrival. No recent chills, nausea, vomiting, shortness of breath, chest pain, abdominal pain, cough, or rhinorrhea. No dysuria, hematuria, urinary frequency/urgency. Denies any lower extremity pain, weakness, loss of sensation or numbness. Denies any history of same. No known allergies to medications.  History reviewed. No pertinent past medical history. History reviewed. No pertinent past surgical history. History reviewed. No pertinent family history. Social History  Substance Use Topics  . Smoking status: Current Every Day Smoker -- 1.00 packs/day for 10 years  . Smokeless tobacco: None  . Alcohol Use: 1.5 oz/week    3 drink(s) per week     Comment: occasion    Review of Systems  Constitutional: Positive for fever (low grade). Negative for chills.  HENT: Positive for sore throat. Negative for congestion and rhinorrhea.   Respiratory: Negative for cough and shortness of breath.   Cardiovascular: Negative for chest pain.  Gastrointestinal: Negative for nausea, vomiting and abdominal pain.  Genitourinary: Negative for dysuria, urgency,  frequency and hematuria.  Musculoskeletal: Positive for back pain and neck pain.  Skin: Negative for rash.  Neurological: Negative for weakness, numbness and headaches.  Psychiatric/Behavioral: Negative for confusion.  All other systems reviewed and are negative.     Allergies  Review of patient's allergies indicates no known allergies.  Home Medications   Prior to Admission medications   Medication Sig Start Date End Date Taking? Authorizing Provider  HYDROcodone-acetaminophen (NORCO/VICODIN) 5-325 MG per tablet Take 1-2 tablets by mouth every 6 (six) hours as needed for moderate pain or severe pain. 07/27/14   Roxy Horsemanobert Browning, PA-C  ibuprofen (ADVIL,MOTRIN) 800 MG tablet Take 1 tablet (800 mg total) by mouth 3 (three) times daily. 07/27/14   Roxy Horsemanobert Browning, PA-C   Triage Vitals: BP 135/67 mmHg  Pulse 84  Temp(Src) 100.5 F (38.1 C) (Oral)  Resp 20  Wt 145 lb (65.772 kg)  SpO2 100%   Physical Exam  Constitutional: He is oriented to person, place, and time. He appears well-developed and well-nourished.  HENT:  Head: Normocephalic.  Mouth/Throat: No oropharyngeal exudate.  oropharnx is slightly red Uvula midline No adenopathy  Eyes: EOM are normal.  Neck: Normal range of motion.  Cardiovascular: Normal rate, regular rhythm and normal heart sounds.   Pulmonary/Chest: Effort normal. He exhibits no tenderness.  Lungs clear to auscultation   Abdominal: Soft. Bowel sounds are normal. He exhibits no distension. There is no tenderness.  Musculoskeletal: Normal range of motion.  Bilateral paraspinal tenderness generalized No swelling or redness   Neurological: He is alert and oriented to person, place, and time.  Psychiatric: He has a normal  mood and affect.  Nursing note and vitals reviewed.   ED Course  Procedures (including critical care time)  DIAGNOSTIC STUDIES: Oxygen Saturation is 100% on RA, Normal by my interpretation.    COORDINATION OF CARE: 10:05  PM-Discussed treatment plan with pt at bedside and pt agreed to plan.     Labs Review Labs Reviewed  CBC - Abnormal; Notable for the following:    WBC 14.7 (*)    All other components within normal limits  BASIC METABOLIC PANEL - Abnormal; Notable for the following:    Chloride 99 (*)    Glucose, Bld 103 (*)    All other components within normal limits  RAPID STREP SCREEN (NOT AT [facility])  CULTURE, GROUP A STREP    Imaging Review No results found. I have personally reviewed and evaluated these images and lab results as part of my medical decision-making.   EKG Interpretation None      MDM   Final diagnoses:  None    1. Pharyngitis  The patient is very well appearing, non-toxic. Strep negative in essentially normal exam. VSS. He is considered appropriate for discharge.   I personally performed the services described in this documentation, which was scribed in my presence. The recorded information has been reviewed and is accurate.     Elpidio Anis, PA-C 09/20/15 2302  Jerelyn Scott, MD 09/20/15 [phone]

## 2015-09-20 NOTE — ED Notes (Signed)
Pt reports throat pain, neck and back pain since this weekend. Pain 5/10. Has had fever.

## 2015-09-24 LAB — CULTURE, GROUP A STREP

## 2017-12-23 ENCOUNTER — Ambulatory Visit (HOSPITAL_COMMUNITY)
Admission: EM | Admit: 2017-12-23 | Discharge: 2017-12-23 | Disposition: A | Discharge status: Home / Self Care | Payer: BLUE CROSS/BLUE SHIELD | Attending: Urgent Care | Admitting: Urgent Care

## 2017-12-23 ENCOUNTER — Encounter (HOSPITAL_COMMUNITY): Payer: Self-pay | Admitting: Emergency Medicine

## 2017-12-23 ENCOUNTER — Other Ambulatory Visit: Payer: Self-pay

## 2017-12-23 DIAGNOSIS — K0889 Other specified disorders of teeth and supporting structures: Secondary | ICD-10-CM

## 2017-12-23 DIAGNOSIS — R220 Localized swelling, mass and lump, head: Secondary | ICD-10-CM

## 2017-12-23 DIAGNOSIS — K047 Periapical abscess without sinus: Secondary | ICD-10-CM

## 2017-12-23 MED ORDER — AMOXICILLIN-POT CLAVULANATE 875-125 MG PO TABS: 1.0000 | ORAL_TABLET | Freq: Two times a day (BID) | ORAL | 0 refills | Status: DC

## 2017-12-23 NOTE — ED Provider Notes (Signed)
  MRN: [id number] DOB: 07-05-1976  Subjective:   Dustin Hendrix is a 42 y.o. male presenting for 1 week history of worsening right upper gum pain, tooth pain, now having facial swelling, lymph node pain in the past 2 days. Denies fever, sinus pain, sinus congestion, sore throat, cough. Does not have a dentist.   Dustin Hendrix has No Known Allergies.  Dustin Hendrix denies past medical and surgical history.   Objective:   Vitals: BP 131/78   Pulse 79   Temp 100.2 F (37.9 C) (Oral)   Resp 18   SpO2 100%   Physical Exam  Constitutional: He is oriented to person, place, and time. He appears well-developed and well-nourished.  HENT:  Mouth/Throat: Dental caries present.    Cardiovascular: Normal rate.  Pulmonary/Chest: Effort normal.  Neurological: He is alert and oriented to person, place, and time.   Assessment and Plan :   Dental infection  Facial swelling  Dentalgia  Start Augmentin for dental infection. Start APAP with ibuprofen. Return-to-clinic precautions discussed, patient verbalized understanding.     Dustin Hendrix, Dustin Brockmann, PA-C 12/23/17 1544

## 2017-12-23 NOTE — ED Triage Notes (Signed)
Pt c/o swelling and pain in his R upper jaw area, c/o tooth pain and gum swelling x1 week.

## 2019-10-20 ENCOUNTER — Other Ambulatory Visit: Payer: Self-pay

## 2019-10-20 DIAGNOSIS — Z20822 Contact with and (suspected) exposure to covid-19: Secondary | ICD-10-CM

## 2019-10-22 LAB — NOVEL CORONAVIRUS, NAA: SARS-CoV-2, NAA: NOT DETECTED

## 2019-12-15 ENCOUNTER — Ambulatory Visit: Payer: Managed Care, Other (non HMO) | Attending: Internal Medicine

## 2019-12-15 DIAGNOSIS — Z20822 Contact with and (suspected) exposure to covid-19: Secondary | ICD-10-CM

## 2019-12-16 LAB — NOVEL CORONAVIRUS, NAA: SARS-CoV-2, NAA: NOT DETECTED

## 2020-09-21 ENCOUNTER — Other Ambulatory Visit: Payer: Managed Care, Other (non HMO)

## 2020-09-21 DIAGNOSIS — Z20822 Contact with and (suspected) exposure to covid-19: Secondary | ICD-10-CM

## 2020-09-22 LAB — SARS-COV-2, NAA 2 DAY TAT

## 2020-09-22 LAB — NOVEL CORONAVIRUS, NAA: SARS-CoV-2, NAA: NOT DETECTED

## 2021-08-26 ENCOUNTER — Ambulatory Visit
Admission: EM | Admit: 2021-08-26 | Discharge: 2021-08-26 | Disposition: A | Discharge status: Home / Self Care | Payer: Managed Care, Other (non HMO) | Attending: Emergency Medicine | Admitting: Emergency Medicine

## 2021-08-26 ENCOUNTER — Other Ambulatory Visit: Payer: Self-pay

## 2021-08-26 DIAGNOSIS — Z202 Contact with and (suspected) exposure to infections with a predominantly sexual mode of transmission: Secondary | ICD-10-CM

## 2021-08-26 DIAGNOSIS — Z113 Encounter for screening for infections with a predominantly sexual mode of transmission: Secondary | ICD-10-CM | POA: Diagnosis present

## 2021-08-26 MED ORDER — DOXYCYCLINE HYCLATE 100 MG PO CAPS: 100.0000 mg | ORAL_CAPSULE | Freq: Two times a day (BID) | ORAL | 0 refills | Status: AC

## 2021-08-26 MED ORDER — METRONIDAZOLE 500 MG PO TABS: 2000.0000 mg | ORAL_TABLET | Freq: Once | ORAL | 0 refills | Status: AC

## 2021-08-26 NOTE — ED Triage Notes (Signed)
Pt requesting to be tested for STD's due to exposure about two weeks ago. Patient states he is not having any symptoms.

## 2021-08-26 NOTE — ED Provider Notes (Signed)
[facility]    CSN: [id number] Arrival date & time: 08/26/21  2633      History   Chief Complaint Chief Complaint  Patient presents with   Exposure to STD    HPI Dustin Hendrix is a 45 y.o. male presenting today for STD screening.  Reports exposure to chlamydia and trichomonas approximately 2 weeks ago.  Has noticed some white discharge in the morning, but otherwise has been asymptomatic.  Denies abdominal pain.  Denies dysuria.  Denies any rashes or lesions  HPI  History reviewed. No pertinent past medical history.  There are no problems to display for this patient.   History reviewed. No pertinent surgical history.     Home Medications    Prior to Admission medications   Medication Sig Start Date End Date Taking? Authorizing Provider  doxycycline (VIBRAMYCIN) 100 MG capsule Take 1 capsule (100 mg total) by mouth 2 (two) times daily for 7 days. 08/26/21 09/02/21 Yes Keiarah Orlowski C, PA-C  metroNIDAZOLE (FLAGYL) 500 MG tablet Take 4 tablets (2,000 mg total) by mouth once for 1 dose. 08/26/21 08/26/21 Yes Chicquita Mendel, Junius Creamer, PA-C    Family History No family history on file.  Social History Social History   Tobacco Use   Smoking status: Every Day    Packs/day: 1.00    Years: 10.00    Pack years: 10.00    Types: Cigarettes   Smokeless tobacco: Never  Vaping Use   Vaping Use: Never used  Substance Use Topics   Alcohol use: Yes    Alcohol/week: 3.0 standard drinks    Types: 3 drink(s) per week    Comment: occasion   Drug use: No     Allergies   Patient has no known allergies.   Review of Systems Review of Systems  Constitutional:  Negative for fatigue and fever.  HENT:  Negative for congestion, sinus pressure and sore throat.   Eyes:  Negative for photophobia, pain and visual disturbance.  Respiratory:  Negative for cough and shortness of breath.   Cardiovascular:  Negative for chest pain.  Gastrointestinal:  Negative for abdominal pain,  nausea and vomiting.  Genitourinary:  Positive for penile discharge. Negative for decreased urine volume and hematuria.  Musculoskeletal:  Negative for myalgias, neck pain and neck stiffness.  Neurological:  Negative for dizziness, syncope, facial asymmetry, speech difficulty, weakness, light-headedness, numbness and headaches.    Physical Exam Triage Vital Signs ED Triage Vitals  Enc Vitals Group     BP      Pulse      Resp      Temp      Temp src      SpO2      Weight      Height      Head Circumference      Peak Flow      Pain Score      Pain Loc      Pain Edu?      Excl. in GC?    No data found.  Updated Vital Signs BP 115/85 (BP Location: Left Arm)   Pulse 69   Temp 98.6 F (37 C) (Oral)   Resp 20   SpO2 98%   Visual Acuity Right Eye Distance:   Left Eye Distance:   Bilateral Distance:    Right Eye Near:   Left Eye Near:    Bilateral Near:     Physical Exam Vitals and nursing note reviewed.  Constitutional:  Appearance: He is well-developed.     Comments: No acute distress  HENT:     Head: Normocephalic and atraumatic.     Nose: Nose normal.  Eyes:     Conjunctiva/sclera: Conjunctivae normal.  Cardiovascular:     Rate and Rhythm: Normal rate.  Pulmonary:     Effort: Pulmonary effort is normal. No respiratory distress.  Abdominal:     General: There is no distension.  Musculoskeletal:        General: Normal range of motion.     Cervical back: Neck supple.  Skin:    General: Skin is warm and dry.  Neurological:     Mental Status: He is alert and oriented to person, place, and time.     UC Treatments / Results  Labs (all labs ordered are listed, but only abnormal results are displayed) Labs Reviewed  HIV ANTIBODY (ROUTINE TESTING W REFLEX)  RPR  CYTOLOGY, (ORAL, ANAL, URETHRAL) ANCILLARY ONLY    EKG   Radiology No results found.  Procedures Procedures (including critical care time)  Medications Ordered in UC Medications -  No data to display  Initial Impression / Assessment and Plan / UC Course  I have reviewed the triage vital signs and the nursing notes.  Pertinent labs & imaging results that were available during my care of the patient were reviewed by me and considered in my medical decision making (see chart for details).     STD exposure/screen-urethral swab pending, HIV and syphilis blood work pending, empirically treating for chlamydia and trichomonas after exposure with Doxy and Flagyl, will call with results and provide further treatment if needed.  Discussed strict return precautions. Patient verbalized understanding and is agreeable with plan.  Final Clinical Impressions(s) / UC Diagnoses   Final diagnoses:  Screen for STD (sexually transmitted disease)  Exposure to chlamydia  Trichomonas exposure     Discharge Instructions      We have screening you for HIV, syphilis, gonorrhea, chlamydia and trichomonas Begin doxycycline twice daily x1 week Take 2 g metronidazole/Flagyl-take with food, no alcohol for 48 hours We will call with results and provide further treatment if needed     ED Prescriptions     Medication Sig Dispense Auth. Provider   doxycycline (VIBRAMYCIN) 100 MG capsule Take 1 capsule (100 mg total) by mouth 2 (two) times daily for 7 days. 14 capsule Eddrick Dilone C, PA-C   metroNIDAZOLE (FLAGYL) 500 MG tablet Take 4 tablets (2,000 mg total) by mouth once for 1 dose. 4 tablet Bandon Sherwin, [place] C, PA-C      PDMP not reviewed this encounter.   Sharyon Cable Rivers C, PA-C 08/26/21 1052

## 2021-08-26 NOTE — Discharge Instructions (Addendum)
We have screening you for HIV, syphilis, gonorrhea, chlamydia and trichomonas Begin doxycycline twice daily x1 week Take 2 g metronidazole/Flagyl-take with food, no alcohol for 48 hours We will call with results and provide further treatment if needed

## 2021-08-27 LAB — RPR: RPR Ser Ql: NONREACTIVE

## 2021-08-27 LAB — HIV ANTIBODY (ROUTINE TESTING W REFLEX): HIV Screen 4th Generation wRfx: NONREACTIVE

## 2021-08-29 LAB — CYTOLOGY, (ORAL, ANAL, URETHRAL) ANCILLARY ONLY
Chlamydia: NEGATIVE
Comment: NEGATIVE
Comment: NEGATIVE
Comment: NORMAL
Neisseria Gonorrhea: NEGATIVE
Trichomonas: POSITIVE — AB

## 2021-11-17 ENCOUNTER — Other Ambulatory Visit: Payer: Self-pay

## 2021-11-17 ENCOUNTER — Ambulatory Visit
Admission: EM | Admit: 2021-11-17 | Discharge: 2021-11-17 | Disposition: A | Discharge status: Home / Self Care | Payer: Managed Care, Other (non HMO) | Attending: Emergency Medicine | Admitting: Emergency Medicine

## 2021-11-17 ENCOUNTER — Telehealth: Payer: Managed Care, Other (non HMO) | Admitting: Physician Assistant

## 2021-11-17 DIAGNOSIS — Z202 Contact with and (suspected) exposure to infections with a predominantly sexual mode of transmission: Secondary | ICD-10-CM | POA: Diagnosis present

## 2021-11-17 DIAGNOSIS — R369 Urethral discharge, unspecified: Secondary | ICD-10-CM

## 2021-11-17 DIAGNOSIS — Z113 Encounter for screening for infections with a predominantly sexual mode of transmission: Secondary | ICD-10-CM | POA: Insufficient documentation

## 2021-11-17 MED ORDER — METRONIDAZOLE 500 MG PO TABS
2000.0000 mg | ORAL_TABLET | Freq: Once | ORAL | Status: AC
  Administered 2021-11-17: 2000 mg via ORAL

## 2021-11-17 NOTE — Discharge Instructions (Signed)
The 4 tablets you received during the visit today were called metronidazole, this is the recommended treatment for trichomonas in adult males.  The results of your STD testing today will be made available to you once received.  They will initially be posted to your MyChart and, if any of your results are abnormal, you will receive a phone call with those results along with further instructions regarding treatment.

## 2021-11-17 NOTE — Progress Notes (Signed)
Based on what you shared with me, I feel your condition warrants further evaluation and I recommend that you be seen in a face to face visit.  Giving active symptoms and exposure to STI, you need an in-person evaluation for treatment and further testing to make sure no other concerns. This is not something we can treat virtually.    NOTE: There will be NO CHARGE for this eVisit   If you are having a true medical emergency please call 911.      For an urgent face to face visit, [facility] has six urgent care centers for your convenience:     [facility] at [facility] Directions [phone] [address] [place], [place] [address]    [facility]) Get Driving Directions [phone] [address] [place], [place] [address]  [facility]) Get Driving Directions [phone] [address] [place],  [place]  [address]  [facility] at [facility] Get Driving Directions [phone] [address] [place], [place] [address]   [facility] at [facility] Get Driving Directions  [phone] [address] [place], [place] [address]   [facility] at [facility] Directions [phone] [address], [address] [place], [place] [address]  Your MyChart E-visit questionnaire answers were reviewed by a board certified advanced clinical practitioner to complete your personal care plan based on your specific symptoms.  Thank you for using e-Visits.

## 2021-11-17 NOTE — ED Provider Notes (Signed)
[facility]    CSN: [id number] Arrival date & time: 11/17/21  1011    HISTORY  No chief complaint on file.  HPI Dustin Hendrix is a 45 y.o. male. Pt states he wants to be tested for STDs, states he was exposed to trichomonas.  He complains of penile discharge and penile itching.  Denies abdominal pain, scrotal swelling, burning with urination, groin pain, testicular pain, blood in his urine, genital lesions, fever.  The history is provided by the patient.  History reviewed. No pertinent past medical history. There are no problems to display for this patient.  History reviewed. No pertinent surgical history.  Home Medications    Prior to Admission medications   Not on File   Family History History reviewed. No pertinent family history. Social History Social History   Tobacco Use   Smoking status: Every Day    Packs/day: 1.00    Years: 10.00    Pack years: 10.00    Types: Cigarettes   Smokeless tobacco: Never  Vaping Use   Vaping Use: Never used  Substance Use Topics   Alcohol use: Yes    Alcohol/week: 3.0 standard drinks    Types: 3 drink(s) per week    Comment: occasion   Drug use: No   Allergies   Patient has no known allergies.  Review of Systems Review of Systems Pertinent findings noted in history of present illness.   Physical Exam Triage Vital Signs ED Triage Vitals  Enc Vitals Group     BP 09/30/21 0827 (!) 147/82     Pulse Rate 09/30/21 0827 72     Resp 09/30/21 0827 18     Temp 09/30/21 0827 98.3 F (36.8 C)     Temp Source 09/30/21 0827 Oral     SpO2 09/30/21 0827 98 %     Weight --      Height --      Head Circumference --      Peak Flow --      Pain Score 09/30/21 0826 5     Pain Loc --      Pain Edu? --      Excl. in GC? --   No data found.  Updated Vital Signs BP 121/79 (BP Location: Right Arm)    Pulse 63    Temp 98.9 F (37.2 C) (Oral)    Resp 18    SpO2 97%   Physical Exam Vitals and nursing note reviewed.   Constitutional:      General: He is not in acute distress.    Appearance: Normal appearance. He is not ill-appearing.  HENT:     Head: Normocephalic and atraumatic.  Eyes:     General: Lids are normal.        Right eye: No discharge.        Left eye: No discharge.     Extraocular Movements: Extraocular movements intact.     Conjunctiva/sclera: Conjunctivae normal.     Right eye: Right conjunctiva is not injected.     Left eye: Left conjunctiva is not injected.  Neck:     Trachea: Trachea and phonation normal.  Cardiovascular:     Rate and Rhythm: Normal rate and regular rhythm.     Pulses: Normal pulses.     Heart sounds: Normal heart sounds. No murmur heard.   No friction rub. No gallop.  Pulmonary:     Effort: Pulmonary effort is normal. No accessory muscle usage, prolonged expiration or respiratory distress.  Breath sounds: Normal breath sounds. No stridor, decreased air movement or transmitted upper airway sounds. No decreased breath sounds, wheezing, rhonchi or rales.  Chest:     Chest wall: No tenderness.  Genitourinary:    Comments: Pt politely declines GU exam, pt did provide a swab for testing.   Musculoskeletal:        General: Normal range of motion.     Cervical back: Normal range of motion and neck supple. Normal range of motion.  Lymphadenopathy:     Cervical: No cervical adenopathy.  Skin:    General: Skin is warm and dry.     Findings: No erythema or rash.  Neurological:     General: No focal deficit present.     Mental Status: He is alert and oriented to person, place, and time.  Psychiatric:        Mood and Affect: Mood normal.        Behavior: Behavior normal.    Visual Acuity Right Eye Distance:   Left Eye Distance:   Bilateral Distance:    Right Eye Near:   Left Eye Near:    Bilateral Near:     UC Couse / Diagnostics / Procedures:    EKG  Radiology No results found.  Procedures Procedures (including critical care time)  UC  Diagnoses / Final Clinical Impressions(s)   I have reviewed the triage vital signs and the nursing notes.  Pertinent labs & imaging results that were available during my care of the patient were reviewed by me and considered in my medical decision making (see chart for details).   Final diagnoses:  Exposure to trichomonas  Screening examination for STD (sexually transmitted disease)   Patient was treated empirically with metronidazole 2 g for presumed trichomonas infection.  Patient advised to be notified of the results of his STD screening and if any further treatment is needed that will be provided for him.  Return precautions advised.  Safe sex practices discussed.  ED Prescriptions   None    PDMP not reviewed this encounter.  Pending results:  Labs Reviewed  CYTOLOGY, (ORAL, ANAL, URETHRAL) ANCILLARY ONLY    Medications Ordered in UC: Medications  metroNIDAZOLE (FLAGYL) tablet 2,000 mg (has no administration in time range)    Disposition Upon Discharge:  Condition: stable for discharge home  Patient presents today with concerns for exposure to sexually transmitted disease, requesting testing.  STD screening was performed as indicated.  Patient has been advised that the results of screening will be made available to them via MyChart and, if there are any positive findings, they will be contacted by phone, recommendations for treatment will be advised and prescriptions will be provided as indicated based on clinical guidelines.  Patient has also been advised that if treatment is recommended, they should abstain from sexual intercourse of all forms until treatment is complete.  Patient has further been advised that once treatment is complete, they have not had a complete resolution of their symptoms, if any, they should continue to abstain from sexual intercourse with all forms and follow-up with her primary care provider or return to urgent care for repeat testing.  As such, the  patient has been evaluated and assessed, work-up was performed and treatment was provided in alignment with urgent care protocols and evidence based medicine.  Patient/parent/caregiver has been advised that the patient may require follow up for further testing and/or treatment if the symptoms continue in spite of treatment, as clinically indicated and appropriate.  Routine  symptom specific, illness specific and/or disease specific instructions were discussed with the patient and/or caregiver at length.  Prevention strategies for avoiding STD exposure were also discussed.  The patient will follow up with their current PCP if and as advised. If the patient does not currently have a PCP we will assist them in obtaining one.   The patient may need specialty follow up if the symptoms continue, in spite of conservative treatment and management, for further workup, evaluation, consultation and treatment as clinically indicated and appropriate.  Patient/parent/caregiver verbalized understanding and agreement of plan as discussed.  All questions were addressed during visit.  Please see discharge instructions below for further details of plan.  Discharge Instructions:   Discharge Instructions      The 4 tablets you received during the visit today were called metronidazole, this is the recommended treatment for trichomonas in adult males.  The results of your STD testing today will be made available to you once received.  They will initially be posted to your MyChart and, if any of your results are abnormal, you will receive a phone call with those results along with further instructions regarding treatment.    This office note has been dictated using Teaching laboratory technician.  Unfortunately, and despite my best efforts, this method of dictation can sometimes lead to occasional typographical or grammatical errors.  I apologize in advance if this occurs.      Theadora Rama Scales,  PA-C 11/17/21 1123

## 2021-11-17 NOTE — ED Triage Notes (Signed)
Pt states he wants to be tested for STDs due to possible exposure to trichomonas. He states he does have some penile itching.

## 2021-11-18 LAB — CYTOLOGY, (ORAL, ANAL, URETHRAL) ANCILLARY ONLY
Chlamydia: NEGATIVE
Comment: NEGATIVE
Comment: NEGATIVE
Comment: NORMAL
Neisseria Gonorrhea: NEGATIVE
Trichomonas: NEGATIVE

## 2021-12-27 ENCOUNTER — Emergency Department (HOSPITAL_BASED_OUTPATIENT_CLINIC_OR_DEPARTMENT_OTHER)
Admission: EM | Admit: 2021-12-27 | Discharge: 2021-12-27 | Disposition: A | Discharge status: Home / Self Care | Payer: Managed Care, Other (non HMO) | Attending: Emergency Medicine | Admitting: Emergency Medicine

## 2021-12-27 ENCOUNTER — Encounter (HOSPITAL_BASED_OUTPATIENT_CLINIC_OR_DEPARTMENT_OTHER): Payer: Self-pay

## 2021-12-27 ENCOUNTER — Other Ambulatory Visit: Payer: Self-pay

## 2021-12-27 DIAGNOSIS — R112 Nausea with vomiting, unspecified: Secondary | ICD-10-CM | POA: Insufficient documentation

## 2021-12-27 DIAGNOSIS — Z79899 Other long term (current) drug therapy: Secondary | ICD-10-CM | POA: Insufficient documentation

## 2021-12-27 LAB — CBC WITH DIFFERENTIAL/PLATELET
Abs Immature Granulocytes: 0.02 10*3/uL (ref 0.00–0.07)
Basophils Absolute: 0.1 10*3/uL (ref 0.0–0.1)
Basophils Relative: 1 %
Eosinophils Absolute: 0.1 10*3/uL (ref 0.0–0.5)
Eosinophils Relative: 1 %
HCT: 41.5 % (ref 39.0–52.0)
Hemoglobin: 13.8 g/dL (ref 13.0–17.0)
Immature Granulocytes: 0 %
Lymphocytes Relative: 19 %
Lymphs Abs: 1.2 10*3/uL (ref 0.7–4.0)
MCH: 28.2 pg (ref 26.0–34.0)
MCHC: 33.3 g/dL (ref 30.0–36.0)
MCV: 84.9 fL (ref 80.0–100.0)
Monocytes Absolute: 0.5 10*3/uL (ref 0.1–1.0)
Monocytes Relative: 8 %
Neutro Abs: 4.6 10*3/uL (ref 1.7–7.7)
Neutrophils Relative %: 71 %
Platelets: 210 10*3/uL (ref 150–400)
RBC: 4.89 MIL/uL (ref 4.22–5.81)
RDW: 12.3 % (ref 11.5–15.5)
WBC: 6.5 10*3/uL (ref 4.0–10.5)
nRBC: 0 % (ref 0.0–0.2)

## 2021-12-27 LAB — COMPREHENSIVE METABOLIC PANEL
ALT: 14 U/L (ref 0–44)
AST: 20 U/L (ref 15–41)
Albumin: 4.2 g/dL (ref 3.5–5.0)
Alkaline Phosphatase: 55 U/L (ref 38–126)
Anion gap: 11 (ref 5–15)
BUN: 11 mg/dL (ref 6–20)
CO2: 25 mmol/L (ref 22–32)
Calcium: 9 mg/dL (ref 8.9–10.3)
Chloride: 101 mmol/L (ref 98–111)
Creatinine, Ser: 1.02 mg/dL (ref 0.61–1.24)
GFR, Estimated: >60 mL/min (ref >60)
Glucose, Bld: 138 mg/dL — ABNORMAL HIGH (ref 70–99)
Potassium: 3.4 mmol/L — ABNORMAL LOW (ref 3.5–5.1)
Sodium: 137 mmol/L (ref 135–145)
Total Bilirubin: 0.9 mg/dL (ref 0.3–1.2)
Total Protein: 7.3 g/dL (ref 6.5–8.1)

## 2021-12-27 LAB — LIPASE, BLOOD: Lipase: 45 U/L (ref 11–51)

## 2021-12-27 MED ORDER — PROCHLORPERAZINE EDISYLATE 10 MG/2ML IJ SOLN
10.0000 mg | Freq: Once | INTRAMUSCULAR | Status: AC
  Administered 2021-12-27: 12:00:00 10 mg via INTRAVENOUS
  Filled 2021-12-27: qty 2

## 2021-12-27 MED ORDER — SODIUM CHLORIDE 0.9 % IV BOLUS
1000.0000 mL | Freq: Once | INTRAVENOUS | Status: AC
  Administered 2021-12-27: 10:00:00 1000 mL via INTRAVENOUS

## 2021-12-27 MED ORDER — MECLIZINE HCL 25 MG PO TABS: 25.0000 mg | ORAL_TABLET | Freq: Three times a day (TID) | ORAL | 0 refills | Status: AC | PRN

## 2021-12-27 MED ORDER — DIPHENHYDRAMINE HCL 50 MG/ML IJ SOLN
25.0000 mg | Freq: Once | INTRAMUSCULAR | Status: AC
  Administered 2021-12-27: 12:00:00 25 mg via INTRAVENOUS
  Filled 2021-12-27: qty 1

## 2021-12-27 MED ORDER — ONDANSETRON HCL 4 MG/2ML IJ SOLN
4.0000 mg | Freq: Once | INTRAMUSCULAR | Status: AC
  Administered 2021-12-27: 10:00:00 4 mg via INTRAVENOUS
  Filled 2021-12-27: qty 2

## 2021-12-27 MED ORDER — ONDANSETRON 4 MG PO TBDP: ORAL_TABLET | ORAL | 0 refills | Status: AC

## 2021-12-27 MED ORDER — MECLIZINE HCL 25 MG PO TABS
25.0000 mg | ORAL_TABLET | Freq: Once | ORAL | Status: AC
  Administered 2021-12-27: 12:00:00 25 mg via ORAL
  Filled 2021-12-27: qty 1

## 2021-12-27 NOTE — ED Notes (Signed)
Pt discharged to home. Discharge instructions have been discussed with patient and/or family members. Pt verbally acknowledges understanding d/c instructions, and endorses comprehension to checkout at registration before leaving.  °

## 2021-12-27 NOTE — Discharge Instructions (Addendum)
Please return for worsening dizziness inability to eat or drink or if you develop a fever.  Please follow-up with your family doctor.  Return at any time you would like to try to obtain advanced imaging.  This would likely be obtained at [facility].

## 2021-12-27 NOTE — ED Provider Notes (Signed)
[facility] EMERGENCY DEPARTMENT Provider Note   CSN: [id number] Arrival date & time: 12/27/21  [id number]     History  Chief Complaint  Patient presents with   Emesis    Dustin Hendrix is a 46 y.o. male.  46 yo M with nausea vomiting.  This started in the middle the night.  Has been throwing up for about 4 hours now.  Not able to tolerate anything by mouth.  Was reportedly normal yesterday.  No suspicious food intake last night.  No known sick contacts.   Emesis     Home Medications Prior to Admission medications   Medication Sig Start Date End Date Taking? Authorizing Provider  meclizine (ANTIVERT) 25 MG tablet Take 1 tablet (25 mg total) by mouth 3 (three) times daily as needed for dizziness. 12/27/21  Yes Melene PlanFloyd, Landrum Carbonell, DO  ondansetron (ZOFRAN-ODT) 4 MG disintegrating tablet 4mg  ODT q4 hours prn nausea/vomit 12/27/21  Yes Melene PlanFloyd, Trygve Thal, DO      Allergies    Patient has no known allergies.    Review of Systems   Review of Systems  Gastrointestinal:  Positive for vomiting.   Physical Exam Updated Vital Signs BP 119/78    Pulse (!) 59    Temp (!) 97.4 F (36.3 C) (Oral)    Resp 18    SpO2 100%  Physical Exam Vitals and nursing note reviewed.  Constitutional:      Appearance: He is well-developed.  HENT:     Head: Normocephalic and atraumatic.  Eyes:     Pupils: Pupils are equal, round, and reactive to light.  Neck:     Vascular: No JVD.  Cardiovascular:     Rate and Rhythm: Normal rate and regular rhythm.     Heart sounds: No murmur heard.   No friction rub. No gallop.  Pulmonary:     Effort: No respiratory distress.     Breath sounds: No wheezing.  Abdominal:     General: There is no distension.     Tenderness: There is no abdominal tenderness. There is no guarding or rebound.     Comments: Benign abdominal exam  Musculoskeletal:        General: Normal range of motion.     Cervical back: Normal range of motion and neck supple.  Skin:    Coloration: Skin  is not pale.     Findings: No rash.  Neurological:     Mental Status: He is alert and oriented to person, place, and time.     Cranial Nerves: Cranial nerves 2-12 are intact.     Motor: Motor function is intact.     Coordination: Coordination is intact.     Gait: Gait is intact.     Comments: Left sided fast going nystagmus. Ambulates with subjective instability. Otherwise benign neuro exam.   Psychiatric:        Behavior: Behavior normal.    ED Results / Procedures / Treatments   Labs (all labs ordered are listed, but only abnormal results are displayed) Labs Reviewed  COMPREHENSIVE METABOLIC PANEL - Abnormal; Notable for the following components:      Result Value   Potassium 3.4 (*)    Glucose, Bld 138 (*)    All other components within normal limits  CBC WITH DIFFERENTIAL/PLATELET  LIPASE, BLOOD    EKG None  Radiology No results found.  Procedures Procedures    Medications Ordered in ED Medications  sodium chloride 0.9 % bolus 1,000 mL (0 mLs Intravenous Stopped  12/27/21 1122)  ondansetron (ZOFRAN) injection 4 mg (4 mg Intravenous Given 12/27/21 0957)  prochlorperazine (COMPAZINE) injection 10 mg (10 mg Intravenous Given 12/27/21 1133)  diphenhydrAMINE (BENADRYL) injection 25 mg (25 mg Intravenous Given 12/27/21 1132)  meclizine (ANTIVERT) tablet 25 mg (25 mg Oral Given 12/27/21 1131)    ED Course/ Medical Decision Making/ A&P                           Medical Decision Making Amount and/or Complexity of Data Reviewed Labs: ordered.  Risk Prescription drug management.   46 yo M with a chief complaints of nausea and vomiting.  Intractable's for about 5 hours.  Not tachycardic not hypotensive no significant abdominal discomfort.  We will give a bolus of IV fluids antiemetics reassess.  Patient is feeling a bit better on reassessment.  Nausea has improved.  Blood work individually interpreted by me with no leukocytosis no acute anemia and no acute electrolyte  abnormality LFTs and lipase are unremarkable making this much less likely to be acute hepatitis or pancreatitis though with recent onset does not completely rule it out.  Will p.o. trial.  With reported dizziness we will attempt to ambulate post IV fluids.   Patient was able to ambulate but felt a bit unsteady.  On my neuro exam the patient does have some fast-growing left-sided nystagmus.  By history the patient is unsure if he felt very dizzy prior to vomiting or if he started vomiting prior to the dizziness.  Patient could have BPPV as it sounds like he had it occur suddenly while he was in bed trying to sleep.  We will give a headache cocktail.  Meclizine.  Reassess.  Patient feeling better on reassessment.  Discussed transfer for MRI. Would prefer to follow-up as an outpatient.  2:37 PM:  I have discussed the diagnosis/risks/treatment options with the patient and believe the pt to be eligible for discharge home to follow-up with PCP. We also discussed returning to the ED immediately if new or worsening sx occur. We discussed the sx which are most concerning (e.g., sudden worsening pain, fever, inability to tolerate by mouth) that necessitate immediate return. Medications administered to the patient during their visit and any new prescriptions provided to the patient are listed below.  Medications given during this visit Medications  sodium chloride 0.9 % bolus 1,000 mL (0 mLs Intravenous Stopped 12/27/21 1122)  ondansetron (ZOFRAN) injection 4 mg (4 mg Intravenous Given 12/27/21 0957)  prochlorperazine (COMPAZINE) injection 10 mg (10 mg Intravenous Given 12/27/21 1133)  diphenhydrAMINE (BENADRYL) injection 25 mg (25 mg Intravenous Given 12/27/21 1132)  meclizine (ANTIVERT) tablet 25 mg (25 mg Oral Given 12/27/21 1131)     The patient appears reasonably screen and/or stabilized for discharge and I doubt any other medical condition or other [facility] requiring further screening, evaluation, or treatment  in the ED at this time prior to discharge.           Final Clinical Impression(s) / ED Diagnoses Final diagnoses:  Nausea and vomiting in adult    Rx / DC Orders ED Discharge Orders          Ordered    meclizine (ANTIVERT) 25 MG tablet  3 times daily PRN        12/27/21 1246    ondansetron (ZOFRAN-ODT) 4 MG disintegrating tablet        12/27/21 1246  Melene Plan, DO 12/27/21 1437

## 2021-12-27 NOTE — ED Notes (Signed)
Pt ambulated and continues to complain of dizziness. EDP at bedside

## 2021-12-27 NOTE — ED Triage Notes (Signed)
Pt reports he woke up at 4am due to vomiting. Reports vomiting green bile x 10. Reports he is dizzy. No diarrhea

## 2023-03-29 ENCOUNTER — Telehealth: Payer: Managed Care, Other (non HMO) | Admitting: Family Medicine

## 2023-03-29 DIAGNOSIS — R3989 Other symptoms and signs involving the genitourinary system: Secondary | ICD-10-CM

## 2023-03-29 NOTE — Progress Notes (Signed)
E-Visit for Urinary Problems ? ?Based on what you shared with me, I feel your condition warrants further evaluation and I recommend that you be seen for a face to face office visit.  Male bladder infections are not very common.  We worry about prostate or kidney conditions.  The standard of care is to examine the abdomen and kidneys, and to do a urine and blood test to make sure that something more serious is not going on.  We recommend that you see a provider today.  If your doctor's office is closed [facility] has the following Urgent Cares: ? ?  ?NOTE: You will not be charged for this e-visit. ? ?If you are having a true medical emergency please call 911.   ? ?  ? For an urgent face to face visit, [facility] has six urgent care centers for your convenience:  ?  ? [facility] [place] ?Get Driving Directions ?[phone] ?[address] ?[place], [place] [address] ?  ? [facility] ([place]) ?Get Driving Directions ?[phone] ?[address] ?[place], [place] [address] ? ?[facility] ([place]) ?Get Driving Directions ?[phone] ?[address] ?[place],  [place]  [address] ? ?[facility] ?Get Driving Directions ?[phone] ?[address] ?[place], [place] [address] ?  ?[facility] [place] ?Get Driving Directions  ?[phone] ?[address] ?[address] ?[place], [place] [address] ?  ? [facility] [place] ?Get Driving Directions ?[phone] ?[address] ?[place], [place] [address] ? ?Your MyChart E-visit questionnaire answers were reviewed by a board certified advanced clinical practitioner to complete your personal care plan based on your specific symptoms.  Thank you for using e-Visits. ?
